# Patient Record
Sex: Male | Born: 1985 | Race: White | Hispanic: No | Marital: Married | Smoking: Former smoker
Health system: Southern US, Community
[De-identification: ages and names within clinical notes are randomized; demographics above are authoritative.]

---
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
## 2005-11-16 ENCOUNTER — Emergency Department (HOSPITAL_COMMUNITY): Admission: EM | Admit: 2005-11-16 | Discharge: 2005-11-16 | Payer: Self-pay | Admitting: Emergency Medicine

## 2008-11-06 ENCOUNTER — Emergency Department (HOSPITAL_COMMUNITY): Admission: EM | Admit: 2008-11-06 | Discharge: 2008-11-06 | Payer: Self-pay | Admitting: Emergency Medicine

## 2009-01-30 ENCOUNTER — Emergency Department (HOSPITAL_COMMUNITY): Admission: EM | Admit: 2009-01-30 | Discharge: 2009-01-30 | Payer: Self-pay | Admitting: Emergency Medicine

## 2010-08-21 ENCOUNTER — Emergency Department (HOSPITAL_COMMUNITY)
Admission: EM | Admit: 2010-08-21 | Discharge: 2010-08-21 | Disposition: A | Discharge status: Home / Self Care | Payer: Self-pay | Attending: Emergency Medicine | Admitting: Emergency Medicine

## 2010-08-21 ENCOUNTER — Emergency Department (HOSPITAL_COMMUNITY): Payer: Self-pay

## 2010-08-21 DIAGNOSIS — X500XXA Overexertion from strenuous movement or load, initial encounter: Secondary | ICD-10-CM | POA: Insufficient documentation

## 2010-08-21 DIAGNOSIS — S93409A Sprain of unspecified ligament of unspecified ankle, initial encounter: Secondary | ICD-10-CM | POA: Insufficient documentation

## 2010-08-21 DIAGNOSIS — F17200 Nicotine dependence, unspecified, uncomplicated: Secondary | ICD-10-CM | POA: Insufficient documentation

## 2011-03-28 ENCOUNTER — Other Ambulatory Visit: Payer: Self-pay | Admitting: Physician Assistant

## 2011-04-17 ENCOUNTER — Emergency Department (HOSPITAL_COMMUNITY)
Admission: EM | Admit: 2011-04-17 | Discharge: 2011-04-17 | Disposition: A | Discharge status: Home / Self Care | Payer: Self-pay | Attending: Emergency Medicine | Admitting: Emergency Medicine

## 2011-04-17 ENCOUNTER — Encounter (HOSPITAL_COMMUNITY): Payer: Self-pay | Admitting: *Deleted

## 2011-04-17 DIAGNOSIS — J029 Acute pharyngitis, unspecified: Secondary | ICD-10-CM | POA: Insufficient documentation

## 2011-04-17 MED ORDER — PENICILLIN G BENZATHINE 1200000 UNIT/2ML IM SUSP
1.2000 10*6.[IU] | Freq: Once | INTRAMUSCULAR | Status: AC
  Administered 2011-04-17: 1.2 10*6.[IU] via INTRAMUSCULAR
  Filled 2011-04-17: qty 2

## 2011-04-17 NOTE — Discharge Instructions (Signed)
Sore Throat A sore throat is felt inside the throat and at the back of the mouth. It hurts to swallow or the throat may feel dry and scratchy. It can be caused by germs, smoking, pollution, or allergies.  HOME CARE   Only take medicine as told by your doctor.   Drink enough fluids to keep your pee (urine) clear or pale yellow.   Eat soft foods.   Do not smoke.   Rinse the mouth (gargle) with warm water or salt water ( teaspoon salt in 8 ounces of water).   Try throat sprays, lozenges, or suck on hard candy.  GET HELP RIGHT AWAY IF:   You have trouble breathing.   Your sore throat lasts longer than 1 week.   There is more puffiness (swelling) in the throat.   The pain is so bad that you are unable to swallow.   You have a very bad headache or a red rash.   You start to throw up (vomit).   You or your child has a temperature by mouth above 102 F (38.9 C), not controlled by medicine.   Your baby is older than 3 months with a rectal temperature of 102 F (38.9 C) or higher.   Your baby is 85 months old or younger with a rectal temperature of 100.4 F (38 C) or higher.  MAKE SURE YOU:   Understand these instructions.   Will watch your condition.   Will get help right away if you are not doing well or get worse.  Document Released: 11/21/2007 Document Revised: 10/24/2010 Document Reviewed: 11/21/2007 [facility] Patient Information 2012 [place], [place].   You have been treated empirically for strep throat.  Take tylenol or ibuprofen  Regularly for fever or pain.  Gargle frequently with salt water.  Chloraseptic will also help.

## 2011-04-17 NOTE — ED Notes (Signed)
Pt presents with sore throat and mild headache.  Throat red and sl swollen.

## 2011-04-17 NOTE — ED Notes (Signed)
Sore throat 5 days

## 2011-04-17 NOTE — ED Provider Notes (Signed)
Medical screening examination/treatment/procedure(s) were performed by non-physician practitioner and as supervising physician I was immediately available for consultation/collaboration.   Benny Lennert, MD 04/17/11 2239

## 2011-04-17 NOTE — ED Provider Notes (Signed)
History     CSN: [id number]  Arrival date & time 04/17/11  1818   First MD Initiated Contact with Patient 04/17/11 1840      Chief Complaint  Patient presents with  . Sore Throat    (Consider location/radiation/quality/duration/timing/severity/associated sxs/prior treatment) Patient is a 26 y.o. male presenting with pharyngitis. The history is provided by the patient. No language interpreter was used.  Sore Throat This is a new problem. The current episode started in the past 7 days. The problem occurs constantly. The problem has been gradually worsening. Associated symptoms include chills, a fever, headaches, myalgias, a sore throat and swollen glands. The symptoms are aggravated by swallowing. He has tried nothing for the symptoms.    History reviewed. No pertinent past medical history.  History reviewed. No pertinent past surgical history.  No family history on file.  History  Substance Use Topics  . Smoking status: Former Games developer  . Smokeless tobacco: Not on file  . Alcohol Use: No      Review of Systems  Constitutional: Positive for fever and chills.  HENT: Positive for ear pain and sore throat.   Musculoskeletal: Positive for myalgias.  Neurological: Positive for headaches.  All other systems reviewed and are negative.    Allergies  Codeine  Home Medications   Current Outpatient Rx  Name Route Sig Dispense Refill  . ACETAMINOPHEN 500 MG PO TABS Oral Take 1,500 mg by mouth as needed. For pain    . IBUPROFEN 200 MG PO TABS Oral Take 800-1,000 mg by mouth as needed. For pain    . LORATADINE 10 MG PO TABS Oral Take 10 mg by mouth once as needed. For allergy relief      BP 152/92  Pulse 83  Temp(Src) 98.1 F (36.7 C) (Oral)  Resp 16  SpO2 98%  Physical Exam  Nursing note and vitals reviewed. Constitutional: He is oriented to person, place, and time. He appears well-developed and well-nourished.  HENT:  Head: Normocephalic and atraumatic.    Mouth/Throat: Uvula is midline and mucous membranes are normal. No uvula swelling. Oropharyngeal exudate present. No posterior oropharyngeal edema, posterior oropharyngeal erythema or tonsillar abscesses.  Eyes: EOM are normal.  Neck: Normal range of motion.  Cardiovascular: Normal rate, regular rhythm, normal heart sounds and intact distal pulses.   Pulmonary/Chest: Effort normal and breath sounds normal. No respiratory distress.  Abdominal: Soft. He exhibits no distension. There is no tenderness.  Musculoskeletal: Normal range of motion.  Lymphadenopathy:    He has cervical adenopathy.  Neurological: He is alert and oriented to person, place, and time.  Skin: Skin is warm and dry.  Psychiatric: He has a normal mood and affect. Judgment normal.    ED Course  Procedures (including critical care time)  Labs Reviewed - No data to display No results found.   No diagnosis found.    MDM          Worthy Rancher, PA 04/17/11 1904  Worthy Rancher, PA 04/17/11 1907

## 2018-12-22 ENCOUNTER — Other Ambulatory Visit: Payer: Self-pay

## 2018-12-22 DIAGNOSIS — Z20822 Contact with and (suspected) exposure to covid-19: Secondary | ICD-10-CM

## 2018-12-24 LAB — NOVEL CORONAVIRUS, NAA: SARS-CoV-2, NAA: NOT DETECTED

## 2019-05-05 ENCOUNTER — Other Ambulatory Visit: Payer: Self-pay

## 2019-05-05 ENCOUNTER — Ambulatory Visit
Admission: EM | Admit: 2019-05-05 | Discharge: 2019-05-05 | Disposition: A | Discharge status: Home / Self Care | Payer: Self-pay | Attending: Emergency Medicine | Admitting: Emergency Medicine

## 2019-05-05 DIAGNOSIS — H9202 Otalgia, left ear: Secondary | ICD-10-CM

## 2019-05-05 DIAGNOSIS — M79641 Pain in right hand: Secondary | ICD-10-CM

## 2019-05-05 DIAGNOSIS — R202 Paresthesia of skin: Secondary | ICD-10-CM

## 2019-05-05 DIAGNOSIS — H66002 Acute suppurative otitis media without spontaneous rupture of ear drum, left ear: Secondary | ICD-10-CM

## 2019-05-05 DIAGNOSIS — R229 Localized swelling, mass and lump, unspecified: Secondary | ICD-10-CM

## 2019-05-05 MED ORDER — AMOXICILLIN-POT CLAVULANATE 875-125 MG PO TABS: 1.0000 | ORAL_TABLET | Freq: Two times a day (BID) | ORAL | 0 refills | Status: AC

## 2019-05-05 NOTE — ED Provider Notes (Signed)
[facility]   [id number] 05/05/19 Arrival Time: 1331  CC: EAR PAIN, forearm pain, and bump on neck  SUBJECTIVE: History from: patient.  Joseph Aguirre is a 34 y.o. male who presents with of bilateral ear pain x 1 month, L > R.  Denies a precipitating event, such as swimming or wearing ear plugs.  Does admit to cleaning his ears with q-tips.  Patient states the pain is intermittent and sharp in character.  Patient has tried ear irrigation without relief.  Denies aggravating factors. Reports pulling white and black "stuff" out of LT ear.   Also mentions intermittent numbness/ tingling/ discomfort to RT hand that radiates to elbow x few months. Occurs daily. Denies precipitating event or injury.  Worse with eating motions about the wrist.  Family hx significant for charcot tooth marie syndrome.  Denies personal hx of this.    Also mentions bump to back of neck x few months.  Hair dresser has encouraged him to have it checked out.  States it sometimes gets bigger.  Denies pain.  Does not have a PCP.  Denies fever, chills, fatigue, sinus pain, rhinorrhea, sore throat, cough, SOB, wheezing, chest pain, nausea, changes in bowel or bladder habits, erythema, swelling, ecchymosis.    ROS: As per HPI.  All other pertinent ROS negative.     History reviewed. No pertinent past medical history. History reviewed. No pertinent surgical history. Allergies  Allergen Reactions  . Codeine Other (See Comments)    UNKNOWN REACTION   No current facility-administered medications on file prior to encounter.   Current Outpatient Medications on File Prior to Encounter  Medication Sig Dispense Refill  . acetaminophen (TYLENOL) 500 MG tablet Take 1,500 mg by mouth as needed. For pain    . ibuprofen (ADVIL,MOTRIN) 200 MG tablet Take 800-1,000 mg by mouth as needed. For pain    . [DISCONTINUED] loratadine (ALLERGY RELIEF) 10 MG tablet Take 10 mg by mouth once as needed. For allergy relief     Social  History   Socioeconomic History  . Marital status: Single    Spouse name: Not on file  . Number of children: Not on file  . Years of education: Not on file  . Highest education level: Not on file  Occupational History  . Not on file  Tobacco Use  . Smoking status: Former Games developer  . Smokeless tobacco: Never Used  Substance and Sexual Activity  . Alcohol use: No  . Drug use: No  . Sexual activity: Not on file  Other Topics Concern  . Not on file  Social History Narrative  . Not on file   Social Determinants of Health   Financial Resource Strain:   . Difficulty of Paying Living Expenses: Not on file  Food Insecurity:   . Worried About Programme researcher, broadcasting/film/video in the Last Year: Not on file  . Ran Out of Food in the Last Year: Not on file  Transportation Needs:   . Lack of Transportation (Medical): Not on file  . Lack of Transportation (Non-Medical): Not on file  Physical Activity:   . Days of Exercise per Week: Not on file  . Minutes of Exercise per Session: Not on file  Stress:   . Feeling of Stress : Not on file  Social Connections:   . Frequency of Communication with Friends and Family: Not on file  . Frequency of Social Gatherings with Friends and Family: Not on file  . Attends Religious Services: Not on file  .  Active Member of Clubs or Organizations: Not on file  . Attends Archivist Meetings: Not on file  . Marital Status: Not on file  Intimate Partner Violence:   . Fear of Current or Ex-Partner: Not on file  . Emotionally Abused: Not on file  . Physically Abused: Not on file  . Sexually Abused: Not on file   Family History  Problem Relation Age of Onset  . Healthy Mother   . Healthy Father     OBJECTIVE:  Vitals:   05/05/19 1333  BP: (!) 151/91  Pulse: 88  Resp: 18  Temp: 98 F (36.7 C)  TempSrc: Oral  SpO2: 96%     General appearance: alert; well-appearing, nontoxic HEENT: Ears: RT EAC with erythema, TM appears injected, LT EAC with white  exudate in 9-10 o'clock position, purulent green discharge behind the TM, erythema present; Eyes: PERRL, EOMI grossly; Nose: patent without rhinorrhea; Throat: oropharynx clear, tonsils not enlarged or erythematous without white tonsillar exudates, uvula midline Neck: supple without LAD Lungs: unlabored respirations, symmetrical air entry; cough: absent; no respiratory distress Heart: regular rate and rhythm.  Radial pulses 2+ symmetrical bilaterally RT upper Extremity: skin intact without erythema, swelling or ecchymosis; NTTP; FROM; strength and sensation intact about the upper extremity; negative phalen's and tinels Skin: warm and dry; 1 cm freely moveable lump to base of skull, cystic in nature, NTTP, no obvious erythema, discharge or bleeding Psychological: alert and cooperative; normal mood and affect  PROCEDURE:  Consent granted.  LT ear lavage performed by RN.  TM visualized.  PT tolerated procedure well.    ASSESSMENT & PLAN:  1. Left ear pain   2. Non-recurrent acute suppurative otitis media of left ear without spontaneous rupture of tympanic membrane   3. Right hand paresthesia   4. Right hand pain   5. Lump of skin     Meds ordered this encounter  Medications  . amoxicillin-clavulanate (AUGMENTIN) 875-125 MG tablet    Sig: Take 1 tablet by mouth every 12 (twelve) hours for 10 days.    Dispense:  20 tablet    Refill:  0    Order Specific Question:   Supervising Provider    Answer:   Raylene Everts [[id number]]   LT ear pain:  Ear lavage performed Rest and drink plenty of fluids Prescribed augmentin take as directed and to completion Continue to use OTC ibuprofen and/ or tylenol as needed for pain control Follow up with PCP if symptoms persists Return here or go to the ER if you have any new or worsening symptoms fever, chills, nausea, vomiting, ear discharge, worsening ear pain despite medication, etc...  RT arm pain: Rest, ice, elevate Use ibuprofen and/ or  tylenol as needed Buy OTC brace for support Follow up with orthopedist and/or PCP for further evaluation and management  Lump on neck: Appears cystic in nature If it is infected it should respond to the antibiotics you were given today Otherwise follow up with PCP or dermatology for further evaluation   Reviewed expectations re: course of current medical issues. Questions answered. Outlined signs and symptoms indicating need for more acute intervention. Patient verbalized understanding. After Visit Summary given.         Lestine Box, PA-C 05/05/19 1425

## 2019-05-05 NOTE — ED Triage Notes (Signed)
Pt states he has had left ear pain and occlusion x1 week. Pt has used ear drops and peroxide which helps some.   Pt also c/o sharp intermittent pain that goes from right wrist to right elbow. Pt states it happens randomly, with or without movement. Also occurs in left arm but mostly in right. Pt is right handed.

## 2019-05-05 NOTE — Discharge Instructions (Signed)
LT ear pain:  Ear lavage performed Rest and drink plenty of fluids Prescribed augmentin take as directed and to completion Continue to use OTC ibuprofen and/ or tylenol as needed for pain control Follow up with PCP if symptoms persists Return here or go to the ER if you have any new or worsening symptoms fever, chills, nausea, vomiting, ear discharge, worsening ear pain despite medication, etc...  RT arm pain: Rest, ice, elevate Use ibuprofen and/ or tylenol as needed Buy OTC brace for support Follow up with orthopedist and/or PCP for further evaluation and management  Lump on neck: Appears cystic in nature If it is infected it should respond to the antibiotics you were given today Otherwise follow up with PCP or dermatology for further evaluation

## 2020-01-28 ENCOUNTER — Ambulatory Visit
Admission: EM | Admit: 2020-01-28 | Discharge: 2020-01-28 | Disposition: A | Discharge status: Home / Self Care | Payer: Self-pay | Attending: Emergency Medicine | Admitting: Emergency Medicine

## 2020-01-28 ENCOUNTER — Other Ambulatory Visit: Payer: Self-pay

## 2020-01-28 ENCOUNTER — Ambulatory Visit (INDEPENDENT_AMBULATORY_CARE_PROVIDER_SITE_OTHER): Payer: Self-pay

## 2020-01-28 ENCOUNTER — Ambulatory Visit: Payer: Self-pay

## 2020-01-28 DIAGNOSIS — W19XXXA Unspecified fall, initial encounter: Secondary | ICD-10-CM

## 2020-01-28 DIAGNOSIS — M79671 Pain in right foot: Secondary | ICD-10-CM

## 2020-01-28 DIAGNOSIS — M25571 Pain in right ankle and joints of right foot: Secondary | ICD-10-CM

## 2020-01-28 DIAGNOSIS — M25532 Pain in left wrist: Secondary | ICD-10-CM

## 2020-01-28 MED ORDER — IBUPROFEN 800 MG PO TABS: 800.0000 mg | ORAL_TABLET | Freq: Three times a day (TID) | ORAL | 0 refills | Status: AC

## 2020-01-28 NOTE — ED Provider Notes (Signed)
[facility]   [id number] 01/28/20 Arrival Time: 1728   Chief Complaint  Patient presents with  . Hand Injury     SUBJECTIVE: History from: patient and family.  Joseph Aguirre is a 34 y.o. male who presented to the urgent care with a complaint of left wrist and the right foot/ankle pain that occurred today.  Developed the symptom after falling and tripping over a cord.  He localizes the pain to the left wrist and right foot/ankle.  He describes the pain as constant and achy.  He has tried OTC medications without relief.  His symptoms are made worse with ROM.  He denies similar symptoms in the past.  Denies chills, fever, nausea, vomiting, diarrhea   ROS: As per HPI.  All other pertinent ROS negative.     History reviewed. No pertinent past medical history. History reviewed. No pertinent surgical history. Allergies  Allergen Reactions  . Codeine Other (See Comments)    UNKNOWN REACTION   No current facility-administered medications on file prior to encounter.   Current Outpatient Medications on File Prior to Encounter  Medication Sig Dispense Refill  . acetaminophen (TYLENOL) 500 MG tablet Take 1,500 mg by mouth as needed. For pain    . [DISCONTINUED] loratadine (ALLERGY RELIEF) 10 MG tablet Take 10 mg by mouth once as needed. For allergy relief     Social History   Socioeconomic History  . Marital status: Single    Spouse name: Not on file  . Number of children: Not on file  . Years of education: Not on file  . Highest education level: Not on file  Occupational History  . Not on file  Tobacco Use  . Smoking status: Former Games developer  . Smokeless tobacco: Never Used  Substance and Sexual Activity  . Alcohol use: No  . Drug use: No  . Sexual activity: Not on file  Other Topics Concern  . Not on file  Social History Narrative  . Not on file   Social Determinants of Health   Financial Resource Strain:   . Difficulty of Paying Living Expenses: Not on file    Food Insecurity:   . Worried About Programme researcher, broadcasting/film/video in the Last Year: Not on file  . Ran Out of Food in the Last Year: Not on file  Transportation Needs:   . Lack of Transportation (Medical): Not on file  . Lack of Transportation (Non-Medical): Not on file  Physical Activity:   . Days of Exercise per Week: Not on file  . Minutes of Exercise per Session: Not on file  Stress:   . Feeling of Stress : Not on file  Social Connections:   . Frequency of Communication with Friends and Family: Not on file  . Frequency of Social Gatherings with Friends and Family: Not on file  . Attends Religious Services: Not on file  . Active Member of Clubs or Organizations: Not on file  . Attends Banker Meetings: Not on file  . Marital Status: Not on file  Intimate Partner Violence:   . Fear of Current or Ex-Partner: Not on file  . Emotionally Abused: Not on file  . Physically Abused: Not on file  . Sexually Abused: Not on file   Family History  Problem Relation Age of Onset  . Healthy Mother   . Healthy Father     OBJECTIVE:  Vitals:   01/28/20 1739  BP: (!) 157/97  Pulse: 84  Resp: 19  Temp: 98.3  F (36.8 C)  SpO2: 96%     Physical Exam Vitals and nursing note reviewed.  Constitutional:      General: He is not in acute distress.    Appearance: Normal appearance. He is normal weight. He is not ill-appearing, toxic-appearing or diaphoretic.  Cardiovascular:     Rate and Rhythm: Normal rate and regular rhythm.     Pulses: Normal pulses.     Heart sounds: Normal heart sounds. No murmur heard.  No friction rub. No gallop.   Pulmonary:     Effort: Pulmonary effort is normal. No respiratory distress.     Breath sounds: Normal breath sounds. No stridor. No wheezing, rhonchi or rales.  Chest:     Chest wall: No tenderness.  Musculoskeletal:        General: Tenderness present.     Right wrist: Normal.     Left wrist: Tenderness present.     Right ankle: Swelling  present. Tenderness present.     Left ankle: Normal.     Right foot: Swelling and tenderness present.     Left foot: Normal.     Comments: The left wrist is with obvious deformity when compared to the right wrist.  Swelling present.  There is no ecchymosis, open wound, lesion, warmth present.  Limited range of motion due to pain.  Neurovascular status intact.   The right foot/ankle is with obvious deformity when compared to the left foot/ankle.  Swelling present.  There is no ecchymosis, open wound, lesion present.  Limited range of motion.  Partial weightbearing. Neurovascular status intact.  Neurological:     Mental Status: He is alert and oriented to person, place, and time.     LABS:  No results found for this or any previous visit (from the past 24 hour(s)).   RADIOLOGY:  DG Wrist Complete Left  Result Date: 01/28/2020 CLINICAL DATA:  34 year old male with trauma to the left wrist. EXAM: LEFT WRIST - COMPLETE 3+ VIEW COMPARISON:  None. FINDINGS: There is no acute fracture or dislocation. No significant arthritic changes. The soft tissues are grossly unremarkable. IMPRESSION: Negative. Electronically Signed   By: Elgie Collard M.D.   On: 01/28/2020 18:39     Left wrist and right ankle/foot x-ray are negative for bony abnormality including fracture or dislocation.  I have reviewed the x-ray myself and the radiologist interpretation.  I am in agreement with the radiologist interpretation.   ASSESSMENT & PLAN:  1. Left wrist pain   2. Acute right ankle pain   3. Right foot pain     Meds ordered this encounter  Medications  . ibuprofen (ADVIL) 800 MG tablet    Sig: Take 1 tablet (800 mg total) by mouth 3 (three) times daily.    Dispense:  30 tablet    Refill:  0    Discharge instructions  Ibuprofen 800 mg was prescribed for pain/take as directed with food Follow RICE instruction that is attached  Follow-up with PCP Return or go to ED if you develop any new or  worsening of symptoms   Reviewed expectations re: course of current medical issues. Questions answered. Outlined signs and symptoms indicating need for more acute intervention. Patient verbalized understanding. After Visit Summary given.         Durward Parcel, FNP 01/28/20 1915

## 2020-01-28 NOTE — ED Triage Notes (Addendum)
Pt presents with injury to left hand. Patient was working on a car and tripped over a cord. When he fell he landed on his left hand. Limited range of motion in left wrist.  Pt is also having pain in his left ankle, states he thinks it is where he tripped on the cord. Foot is swollen and pain is worse with movement.

## 2020-01-28 NOTE — Discharge Instructions (Signed)
Ibuprofen 800 mg was prescribed for pain/take as directed with food Follow RICE instruction that is attached  Follow-up with PCP Return or go to ED if you develop any new or worsening of symptom

## 2021-01-22 ENCOUNTER — Ambulatory Visit
Admission: EM | Admit: 2021-01-22 | Discharge: 2021-01-22 | Disposition: A | Discharge status: Home / Self Care | Payer: Self-pay | Attending: Family Medicine | Admitting: Family Medicine

## 2021-01-22 ENCOUNTER — Other Ambulatory Visit: Payer: Self-pay

## 2021-01-22 DIAGNOSIS — S61313A Laceration without foreign body of left middle finger with damage to nail, initial encounter: Secondary | ICD-10-CM

## 2021-01-22 MED ORDER — HIBICLENS 4 % EX LIQD: Freq: Every day | CUTANEOUS | 0 refills | Status: DC | PRN

## 2021-01-22 MED ORDER — MUPIROCIN 2 % EX OINT: 1.0000 "application " | TOPICAL_OINTMENT | Freq: Two times a day (BID) | CUTANEOUS | 0 refills | Status: DC

## 2021-01-22 MED ORDER — TRAMADOL HCL 50 MG PO TABS: 50.0000 mg | ORAL_TABLET | Freq: Two times a day (BID) | ORAL | 0 refills | Status: DC | PRN

## 2021-01-22 NOTE — ED Triage Notes (Signed)
Pt has laceration to tip of left 2nd finger  , heavy object fell on finger , bleeding controlled

## 2021-01-27 NOTE — ED Provider Notes (Signed)
[facility]    CSN: [id number] Arrival date & time: 01/22/21  1759      History   Chief Complaint Chief Complaint  Patient presents with   Laceration    HPI OSTIN MATHEY is a 35 y.o. male.   Presenting today with crush injury to left middle finger. 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 He states his ROM is intact, no numbness or tingling and bleeding fairly controlled from laceration sites with direct pressure. Rinsed with soap and water so far. Unclear on last tdap.    History reviewed. No pertinent past medical history.  There are no problems to display for this patient.   History reviewed. No pertinent surgical history.     Home Medications    Prior to Admission medications   Medication Sig Start Date End Date Taking? Authorizing Provider  chlorhexidine (HIBICLENS) 4 % external liquid Apply topically daily as needed. 01/22/21  Yes Particia Nearing, PA-C  mupirocin ointment (BACTROBAN) 2 % Apply 1 application topically 2 (two) times daily. 01/22/21  Yes Particia Nearing, PA-C  traMADol (ULTRAM) 50 MG tablet Take 1 tablet (50 mg total) by mouth every 12 (twelve) hours as needed. 01/22/21  Yes Particia Nearing, PA-C  acetaminophen (TYLENOL) 500 MG tablet Take 1,500 mg by mouth as needed. For pain    [provider]  ibuprofen (ADVIL) 800 MG tablet Take 1 tablet (800 mg total) by mouth 3 (three) times daily. 01/28/20   Avegno, Zachery Dakins, FNP  loratadine (ALLERGY RELIEF) 10 MG tablet Take 10 mg by mouth once as needed. For allergy relief  05/05/19  [provider]    Family History Family History  Problem Relation Age of Onset   Healthy Mother    Healthy Father     Social History Social History   Tobacco Use   Smoking status: Former   Smokeless tobacco: Never  Substance Use Topics   Alcohol use: No   Drug use: No     Allergies   Codeine   Review of Systems Review of  Systems PER HPI   Physical Exam Triage Vital Signs ED Triage Vitals  Enc Vitals Group     BP 01/22/21 1948 (!) 168/100     Pulse Rate 01/22/21 1948 75     Resp 01/22/21 1948 20     Temp 01/22/21 1948 98.5 F (36.9 C)     Temp src --      SpO2 01/22/21 1948 98 %     Weight --      Height --      Head Circumference --      Peak Flow --      Pain Score 01/22/21 1945 4     Pain Loc --      Pain Edu? --      Excl. in GC? --    No data found.  Updated Vital Signs BP (!) 168/100   Pulse 75   Temp 98.5 F (36.9 C)   Resp 20   SpO2 98%   Visual Acuity Right Eye Distance:   Left Eye Distance:   Bilateral Distance:    Right Eye Near:   Left Eye Near:    Bilateral Near:     Physical Exam Vitals and nursing note reviewed.  Constitutional:      Appearance: Normal appearance.  HENT:     Head: Atraumatic.  Eyes:     Extraocular Movements: Extraocular  movements intact.     Conjunctiva/sclera: Conjunctivae normal.  Cardiovascular:     Rate and Rhythm: Normal rate and regular rhythm.  Pulmonary:     Effort: Pulmonary effort is normal.     Breath sounds: Normal breath sounds.  Musculoskeletal:        General: Tenderness and signs of injury present. Normal range of motion.     Cervical back: Normal range of motion and neck supple.  Skin:    General: Skin is warm.     Comments: Slightly gaping laceration to b/l edges of distal left middle finger. Small amount of distal nail avulsed. Bleeding well controlled. Edges of lacerations not able to approximate.   Neurological:     General: No focal deficit present.     Mental Status: He is oriented to person, place, and time.     Comments: Left hand neurovascularly intact  Psychiatric:        Mood and Affect: Mood normal.        Thought Content: Thought content normal.        Judgment: Judgment normal.     UC Treatments / Results  Labs (all labs ordered are listed, but only abnormal results are displayed) Labs Reviewed  - No data to display  EKG   Radiology No results found.  Procedures Laceration Repair  Date/Time: 01/27/2021 7:02 AM Performed by: Particia Nearing, PA-C Authorized by: Particia Nearing, PA-C   Consent:    Consent obtained:  Verbal   Consent given by:  Patient   Risks, benefits, and alternatives were discussed: yes     Risks discussed:  Infection and pain Universal protocol:    Procedure explained and questions answered to patient or proxy's satisfaction: yes     Patient identity confirmed:  Verbally with patient Anesthesia:    Anesthesia method:  None Laceration details:    Location:  Finger   Finger location:  L long finger   Length (cm):  2   Depth (mm):  2 Pre-procedure details:    Preparation:  Patient was prepped and draped in usual sterile fashion Exploration:    Limited defect created (wound extended): no     Hemostasis achieved with:  Direct pressure   Wound exploration: wound explored through full range of motion     Contaminated: no   Treatment:    Area cleansed with:  Chlorhexidine   Amount of cleaning:  Standard   Irrigation solution:  Sterile saline   Irrigation method:  Pressure wash   Visualized foreign bodies/material removed: no     Debridement:  None Skin repair:    Repair method:  Tissue adhesive Approximation:    Approximation:  Loose Repair type:    Repair type:  Simple Post-procedure details:    Dressing:  Splint for protection and non-adherent dressing   Procedure completion:  Tolerated (including critical care time)  Medications Ordered in UC Medications - No data to display  Initial Impression / Assessment and Plan / UC Course  I have reviewed the triage vital signs and the nursing notes.  Pertinent labs & imaging results that were available during my care of the patient were reviewed by me and considered in my medical decision making (see chart for details).     Lacerations stable without suture and not able to be  well approximated, but offered sutures to tack edges down. He opts for dermabond closure after thorough cleaning with hibiclens. He declines x-ray or tdap today though recommended both. Given extent  of laceration will start keflex, hibiclens and small amount of tramadol for pain control in addition to OTC pain relievers. Follow up for worsening sxs.   Final Clinical Impressions(s) / UC Diagnoses   Final diagnoses:  Laceration of left middle finger without foreign body with damage to nail, initial encounter   Discharge Instructions   None    ED Prescriptions     Medication Sig Dispense Auth. Provider   chlorhexidine (HIBICLENS) 4 % external liquid Apply topically daily as needed. 120 mL Particia Nearing, PA-C   mupirocin ointment (BACTROBAN) 2 % Apply 1 application topically 2 (two) times daily. 22 g Particia Nearing, [place]   traMADol (ULTRAM) 50 MG tablet Take 1 tablet (50 mg total) by mouth every 12 (twelve) hours as needed. 10 tablet Particia Nearing, [place]      I have reviewed the PDMP during this encounter.   Roosvelt Maser [place], [place] 01/27/21 [phone]

## 2022-02-01 IMAGING — DX DG FOOT COMPLETE 3+V*R*
3 series · 3 of 3 positions shown · non-contrast
Comparison: None.

CLINICAL DATA: 34-year-old male with trauma to the right ankle.

EXAM:
RIGHT FOOT COMPLETE - 3+ VIEW; RIGHT ANKLE - COMPLETE 3+ VIEW

[foot ap]
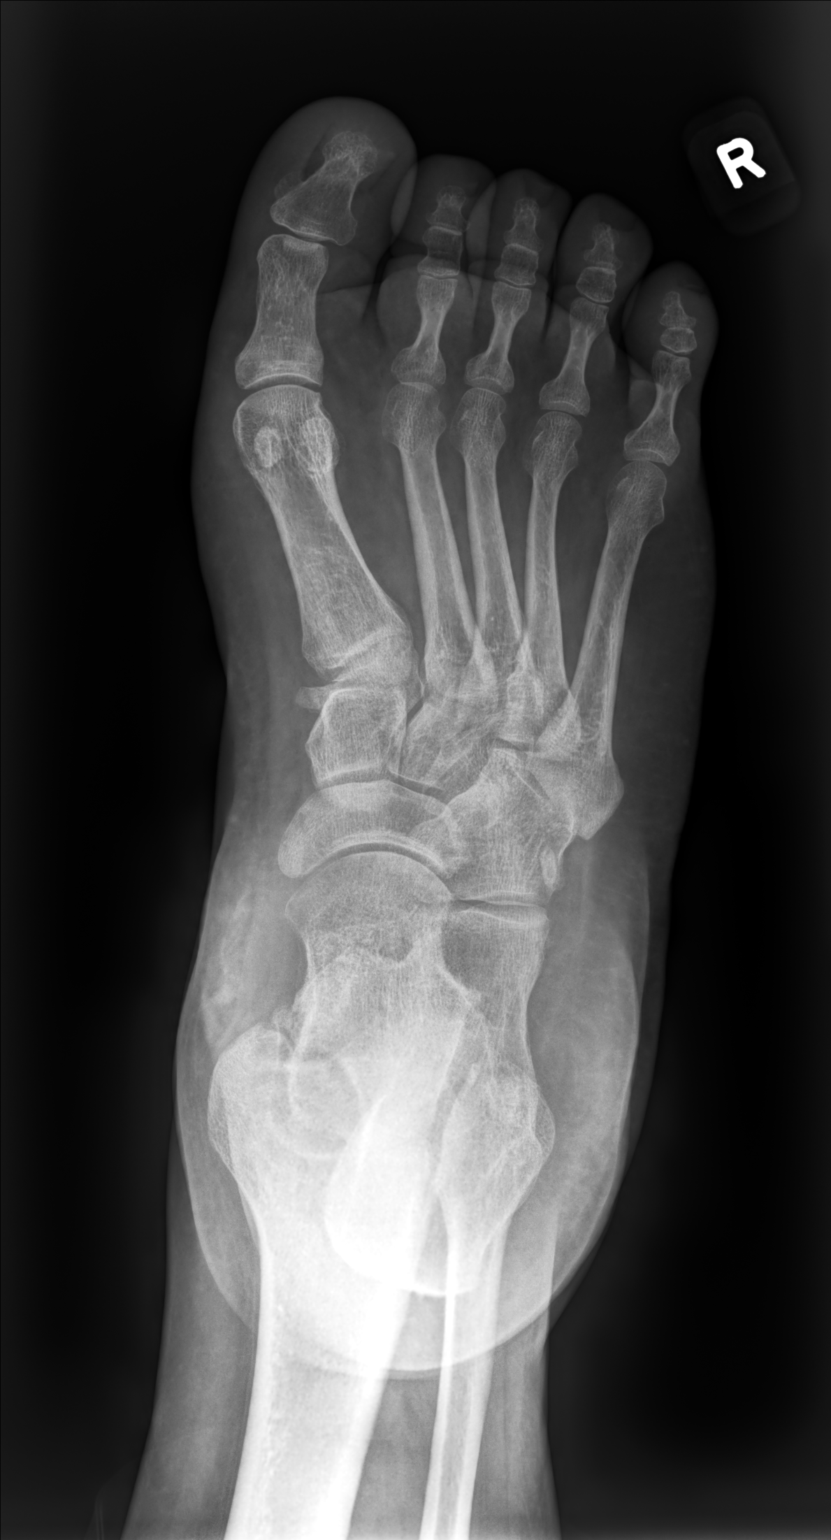

[foot mlo]
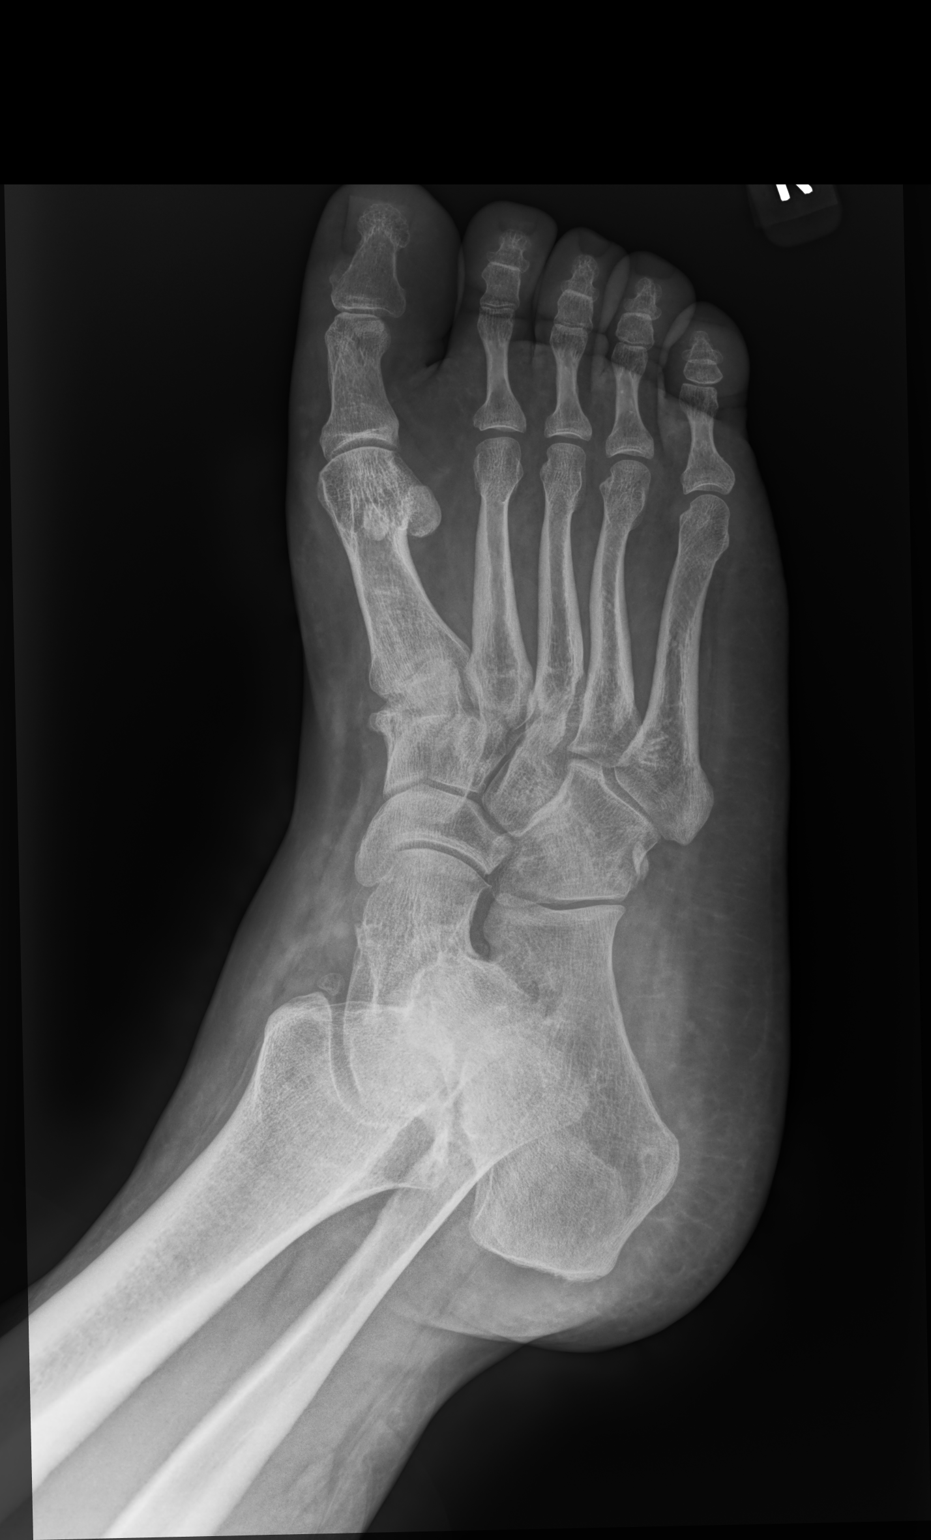

[foot lat]
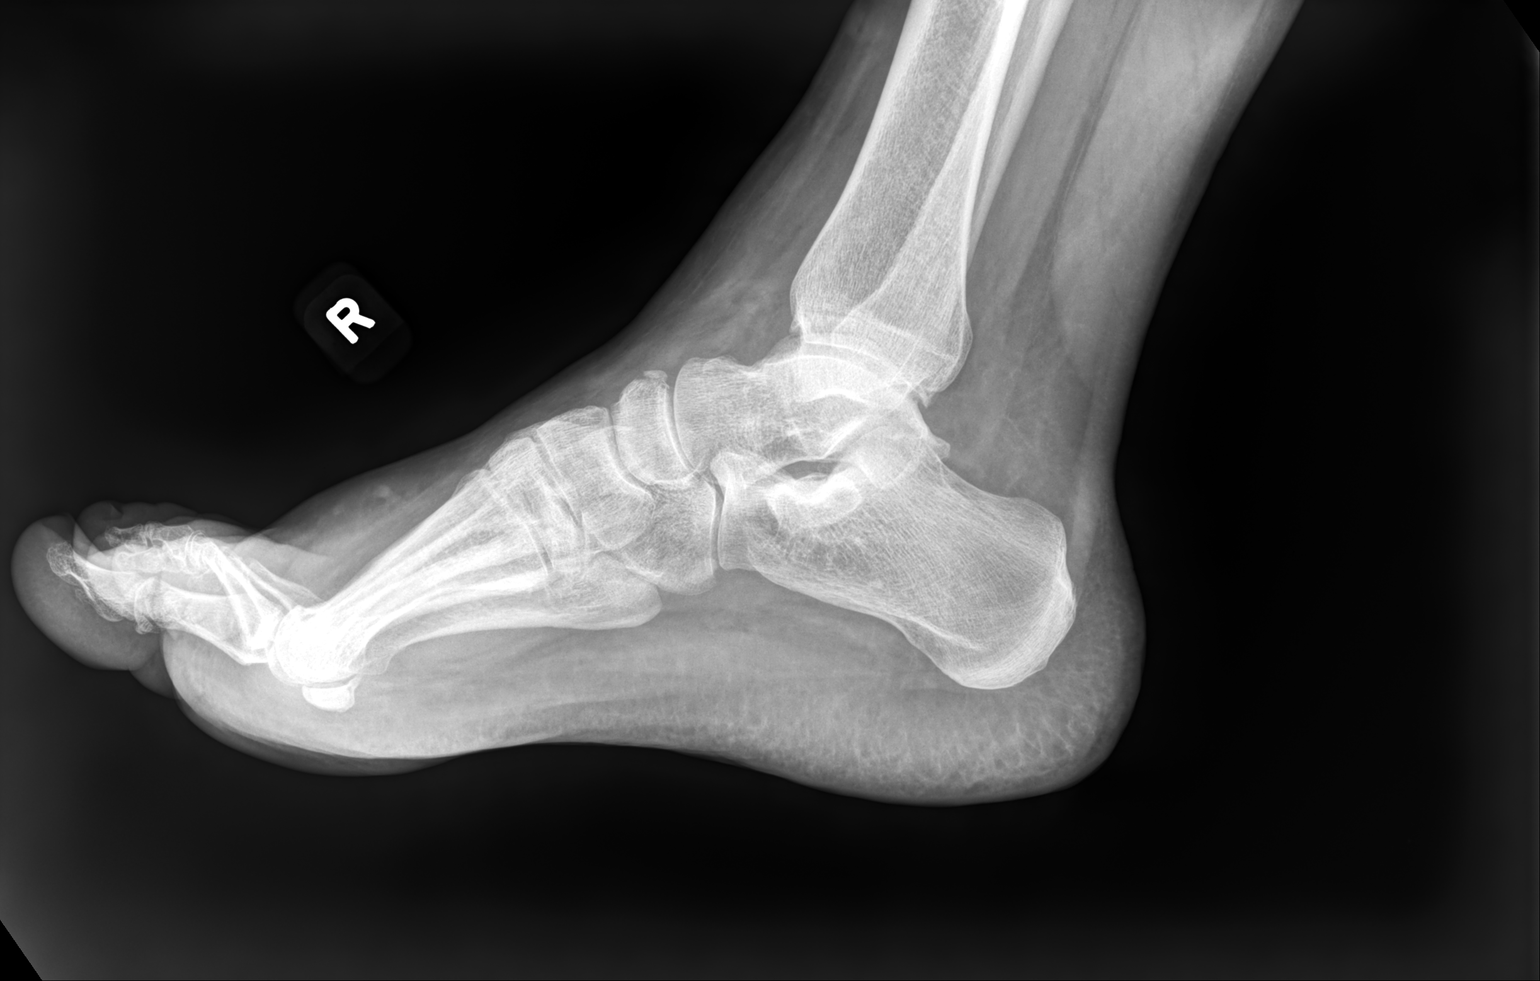

[3 of 3 positions shown; findings below may reference images not displayed]

FINDINGS: There is no acute fracture or dislocation. No significant arthritic
changes. The soft tissues are grossly unremarkable.
IMPRESSION: No acute fracture or dislocation.

## 2022-02-01 IMAGING — DX DG ANKLE COMPLETE 3+V*R*
3 series · 3 of 3 positions shown · non-contrast
Comparison: None.

CLINICAL DATA: 34-year-old male with trauma to the right ankle.

EXAM:
RIGHT FOOT COMPLETE - 3+ VIEW; RIGHT ANKLE - COMPLETE 3+ VIEW

[ankle ap]
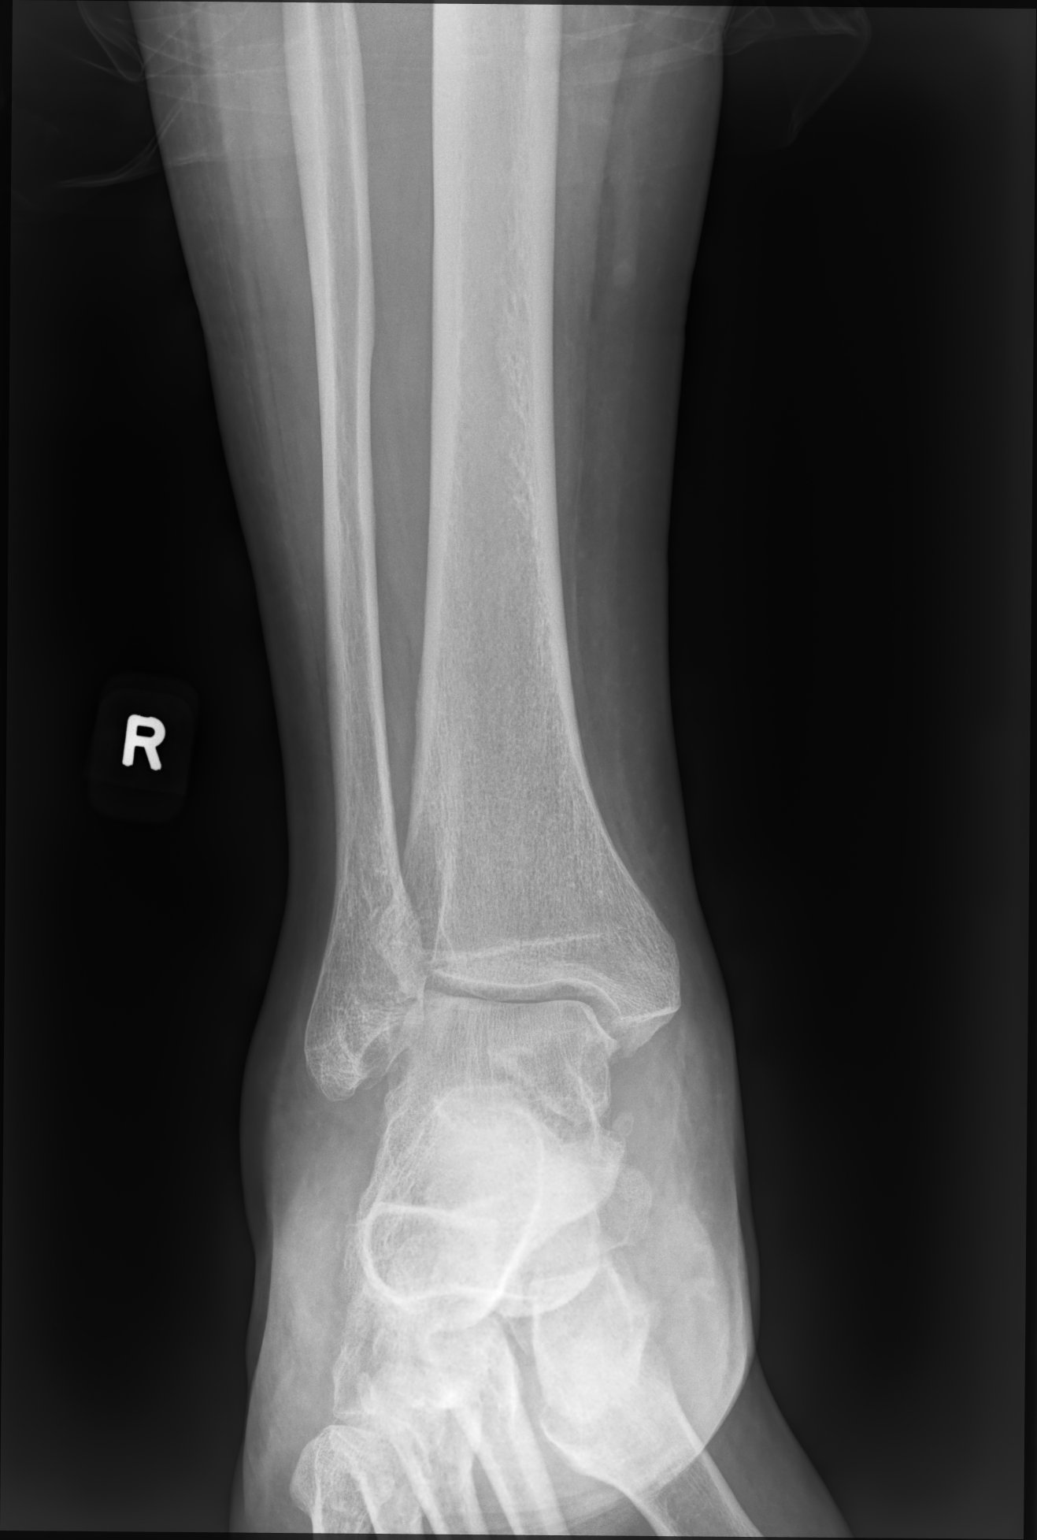

[ankle mlo]
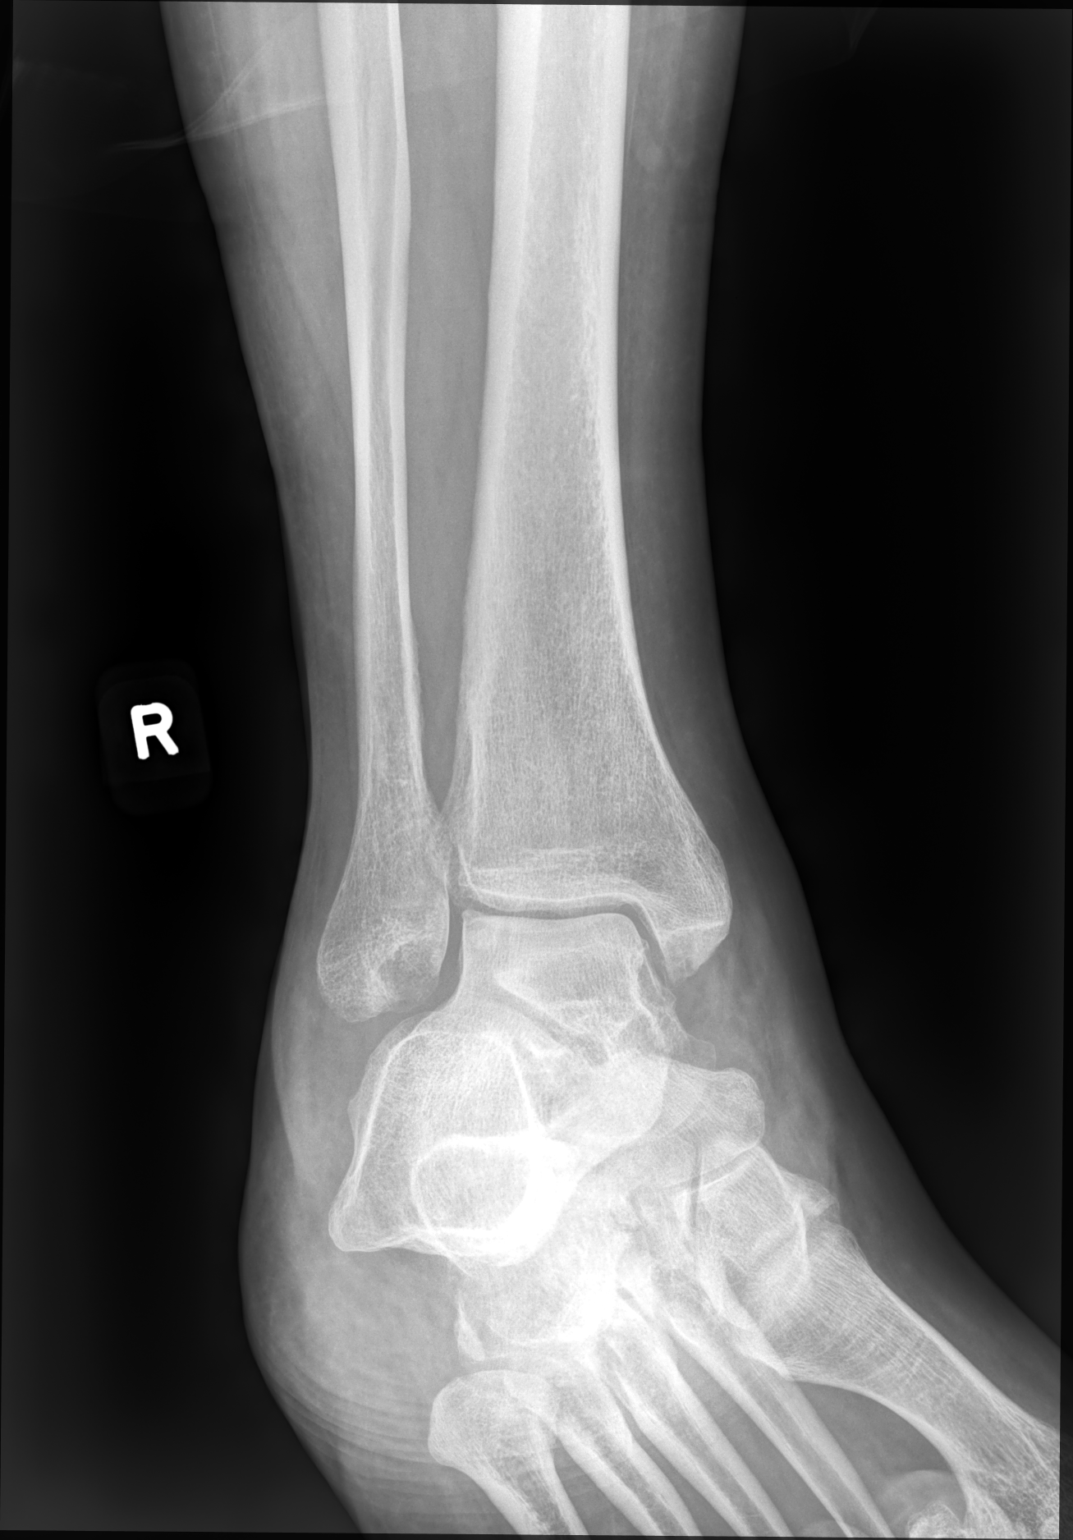

[ankle lat]
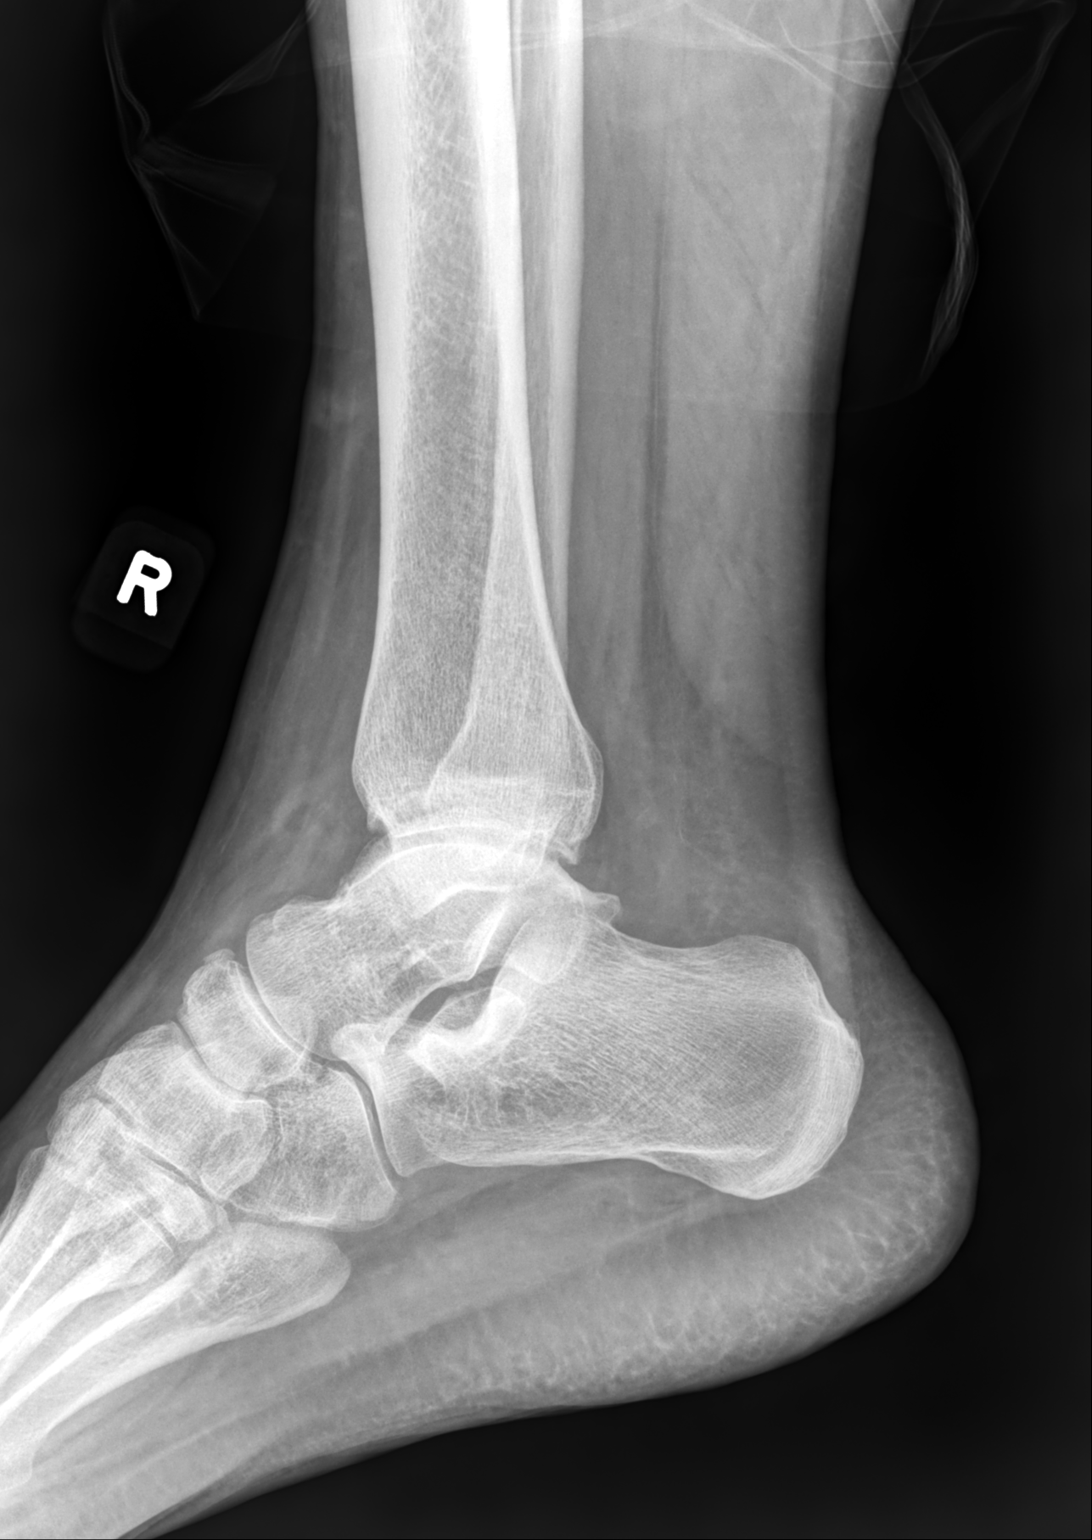

[3 of 3 positions shown; findings below may reference images not displayed]

FINDINGS: There is no acute fracture or dislocation. No significant arthritic
changes. The soft tissues are grossly unremarkable.
IMPRESSION: No acute fracture or dislocation.

## 2022-04-24 DIAGNOSIS — Z6841 Body Mass Index (BMI) 40.0 and over, adult: Secondary | ICD-10-CM | POA: Diagnosis not present

## 2022-04-24 DIAGNOSIS — E790 Hyperuricemia without signs of inflammatory arthritis and tophaceous disease: Secondary | ICD-10-CM | POA: Diagnosis not present

## 2022-06-21 DIAGNOSIS — L723 Sebaceous cyst: Secondary | ICD-10-CM | POA: Diagnosis not present

## 2022-06-21 DIAGNOSIS — F419 Anxiety disorder, unspecified: Secondary | ICD-10-CM | POA: Diagnosis not present

## 2022-06-21 DIAGNOSIS — R202 Paresthesia of skin: Secondary | ICD-10-CM | POA: Diagnosis not present

## 2022-06-21 DIAGNOSIS — K219 Gastro-esophageal reflux disease without esophagitis: Secondary | ICD-10-CM | POA: Diagnosis not present

## 2022-06-21 DIAGNOSIS — Z6841 Body Mass Index (BMI) 40.0 and over, adult: Secondary | ICD-10-CM | POA: Diagnosis not present

## 2022-06-21 DIAGNOSIS — R110 Nausea: Secondary | ICD-10-CM | POA: Diagnosis not present

## 2022-06-21 DIAGNOSIS — J310 Chronic rhinitis: Secondary | ICD-10-CM | POA: Diagnosis not present

## 2022-06-21 DIAGNOSIS — R030 Elevated blood-pressure reading, without diagnosis of hypertension: Secondary | ICD-10-CM | POA: Diagnosis not present

## 2022-06-24 ENCOUNTER — Other Ambulatory Visit (HOSPITAL_COMMUNITY): Payer: Self-pay | Admitting: Physician Assistant

## 2022-06-24 DIAGNOSIS — R110 Nausea: Secondary | ICD-10-CM

## 2022-07-10 ENCOUNTER — Ambulatory Visit (HOSPITAL_COMMUNITY)
Admission: RE | Admit: 2022-07-10 | Discharge: 2022-07-10 | Disposition: A | Discharge status: Home / Self Care | Payer: 59 | Source: Ambulatory Visit | Attending: Physician Assistant | Admitting: Physician Assistant

## 2022-07-10 DIAGNOSIS — R110 Nausea: Secondary | ICD-10-CM | POA: Diagnosis not present

## 2022-07-10 DIAGNOSIS — K760 Fatty (change of) liver, not elsewhere classified: Secondary | ICD-10-CM | POA: Diagnosis not present

## 2022-07-10 DIAGNOSIS — K8020 Calculus of gallbladder without cholecystitis without obstruction: Secondary | ICD-10-CM | POA: Diagnosis not present

## 2022-07-15 ENCOUNTER — Encounter (HOSPITAL_COMMUNITY): Payer: Self-pay | Admitting: Physician Assistant

## 2022-07-15 ENCOUNTER — Other Ambulatory Visit (HOSPITAL_COMMUNITY): Payer: Self-pay | Admitting: Physician Assistant

## 2022-07-15 DIAGNOSIS — N2889 Other specified disorders of kidney and ureter: Secondary | ICD-10-CM

## 2022-07-18 DIAGNOSIS — J342 Deviated nasal septum: Secondary | ICD-10-CM | POA: Diagnosis not present

## 2022-07-18 DIAGNOSIS — J310 Chronic rhinitis: Secondary | ICD-10-CM | POA: Diagnosis not present

## 2022-07-18 DIAGNOSIS — K8010 Calculus of gallbladder with chronic cholecystitis without obstruction: Secondary | ICD-10-CM | POA: Diagnosis not present

## 2022-07-18 DIAGNOSIS — J343 Hypertrophy of nasal turbinates: Secondary | ICD-10-CM | POA: Diagnosis not present

## 2022-08-22 ENCOUNTER — Encounter (HOSPITAL_COMMUNITY): Payer: Self-pay

## 2022-08-22 ENCOUNTER — Ambulatory Visit (HOSPITAL_COMMUNITY): Payer: 59

## 2022-08-28 ENCOUNTER — Other Ambulatory Visit (HOSPITAL_COMMUNITY): Payer: Self-pay | Admitting: Physician Assistant

## 2022-08-28 DIAGNOSIS — N2889 Other specified disorders of kidney and ureter: Secondary | ICD-10-CM

## 2022-09-11 ENCOUNTER — Encounter: Payer: Self-pay | Admitting: Diagnostic Neuroimaging

## 2022-09-11 ENCOUNTER — Ambulatory Visit: Payer: 59 | Admitting: Diagnostic Neuroimaging

## 2022-09-11 VITALS — BP 153/98 | HR 70 | Ht 70.0 in | Wt 302.0 lb

## 2022-09-11 DIAGNOSIS — R200 Anesthesia of skin: Secondary | ICD-10-CM | POA: Diagnosis not present

## 2022-09-11 NOTE — Progress Notes (Signed)
[facility]  PATIENT: Joseph Aguirre DOB: Jun 25, 1985  REFERRING CLINICIAN: Royann Shivers, * HISTORY FROM: patient REASON FOR VISIT: new consult   HISTORICAL  CHIEF COMPLAINT:  Chief Complaint  Patient presents with   New Patient (Initial Visit)    Patient in room #7 with his wife. Patient states for awhile he has been feeling tingling and numbness in both hand. Patient it hard for him to grab and hold on to items    HISTORY OF PRESENT ILLNESS:   37 year old male here for evaluation of numbness and tingling.  Symptoms started about 5 years ago with intermittent numbness tingling in the hands and fingers.  Sometimes this is triggered by certain activities.  No issues with feet.  Has family history of Charcot-Marie-Tooth in father and paternal aunt.  No major issues with neck or low back pain.   REVIEW OF SYSTEMS: Full 14 system review of systems performed and negative with exception of: as per HPI.  ALLERGIES: Allergies  Allergen Reactions   Codeine Other (See Comments)    UNKNOWN REACTION    HOME MEDICATIONS: Outpatient Medications Prior to Visit  Medication Sig Dispense Refill   acetaminophen (TYLENOL) 500 MG tablet Take 1,500 mg by mouth as needed. For pain     ibuprofen (ADVIL) 800 MG tablet Take 1 tablet (800 mg total) by mouth 3 (three) times daily. 30 tablet 0   chlorhexidine (HIBICLENS) 4 % external liquid Apply topically daily as needed. (Patient not taking: Reported on 09/11/2022) 120 mL 0   mupirocin ointment (BACTROBAN) 2 % Apply 1 application topically 2 (two) times daily. (Patient not taking: Reported on 09/11/2022) 22 g 0   traMADol (ULTRAM) 50 MG tablet Take 1 tablet (50 mg total) by mouth every 12 (twelve) hours as needed. (Patient not taking: Reported on 09/11/2022) 10 tablet 0   No facility-administered medications prior to visit.    PAST MEDICAL HISTORY: History reviewed. No pertinent past medical history.  PAST SURGICAL  HISTORY: History reviewed. No pertinent surgical history.  FAMILY HISTORY: Family History  Problem Relation Age of Onset   Healthy Mother    Healthy Father    Charcot-Marie-Tooth disease Father     SOCIAL HISTORY: Social History   Socioeconomic History   Marital status: Married    Spouse name: Not on file   Number of children: Not on file   Years of education: Not on file   Highest education level: Not on file  Occupational History   Not on file  Tobacco Use   Smoking status: Former   Smokeless tobacco: Never  Substance and Sexual Activity   Alcohol use: No   Drug use: No   Sexual activity: Not on file  Other Topics Concern   Not on file  Social History Narrative   Not on file   Social Determinants of Health   Financial Resource Strain: Not on file  Food Insecurity: Not on file  Transportation Needs: Not on file  Physical Activity: Not on file  Stress: Not on file  Social Connections: Not on file  Intimate Partner Violence: Not on file     PHYSICAL EXAM  GENERAL EXAM/CONSTITUTIONAL: Vitals:  Vitals:   09/11/22 1015  BP: (!) 153/98  Pulse: 70  Weight: (!) 302 lb (137 kg)  Height: 5\' 10"  (1.778 m)   Body mass index is 43.33 kg/m. Wt Readings from Last 3 Encounters:  09/11/22 (!) 302 lb (137 kg)   Patient is in no distress; well  developed, nourished and groomed; neck is supple  CARDIOVASCULAR: Examination of carotid arteries is normal; no carotid bruits Regular rate and rhythm, no murmurs Examination of peripheral vascular system by observation and palpation is normal  EYES: Ophthalmoscopic exam of optic discs and posterior segments is normal; no papilledema or hemorrhages No results found.  MUSCULOSKELETAL: Gait, strength, tone, movements noted in Neurologic exam below  NEUROLOGIC: MENTAL STATUS:      No data to display         awake, alert, oriented to person, place and time recent and remote memory intact normal attention and  concentration language fluent, comprehension intact, naming intact fund of knowledge appropriate  CRANIAL NERVE:  2nd - no papilledema on fundoscopic exam 2nd, 3rd, 4th, 6th - pupils equal and reactive to light, visual fields full to confrontation, extraocular muscles intact, no nystagmus 5th - facial sensation symmetric 7th - facial strength symmetric 8th - hearing intact 9th - palate elevates symmetrically, uvula midline 11th - shoulder shrug symmetric 12th - tongue protrusion midline  MOTOR:  normal bulk and tone, full strength in the BUE, BLE HIGH ARCHES; BORDERLINE HAMMERTOES; DECR STRENGTH IN TOES  SENSORY:  normal and symmetric to light touch, pinprick, temperature, vibration  COORDINATION:  finger-nose-finger, fine finger movements normal  REFLEXES:  deep tendon reflexes 1+ and symmetric; TRACE AT ANKLES  GAIT/STATION:  narrow based gait     DIAGNOSTIC DATA (LABS, IMAGING, TESTING) - I reviewed patient records, labs, notes, testing and imaging myself where available.  No results found for: "WBC", "HGB", "HCT", "MCV", "PLT" No results found for: "NA", "K", "CL", "CO2", "GLUCOSE", "BUN", "CREATININE", "CALCIUM", "PROT", "ALBUMIN", "AST", "ALT", "ALKPHOS", "BILITOT", "GFRNONAA", "GFRAA" No results found for: "CHOL", "HDL", "LDLCALC", "LDLDIRECT", "TRIG", "CHOLHDL" No results found for: "HGBA1C" No results found for: "VITAMINB12" No results found for: "TSH"   GLUCOSE 81  URIC ACID 8.8 (h)    ASSESSMENT AND PLAN  37 y.o. year old male here with:   Dx:  1. Numbness in both hands     PLAN:  INTERMITTENT NUMBNESS - check EMG/NCS (neuropathy evaluation; RUE, RLE)  Orders Placed This Encounter  Procedures   NCV with EMG(electromyography)   Return for for NCV/EMG.    Suanne Marker, MD 09/11/2022, 11:29 AM Certified in Neurology, Neurophysiology and Neuroimaging  [facility] [address] [place], [place]  [address] [phone]

## 2022-09-26 ENCOUNTER — Encounter (HOSPITAL_COMMUNITY): Payer: Self-pay

## 2022-09-26 ENCOUNTER — Ambulatory Visit (HOSPITAL_COMMUNITY): Payer: 59

## 2022-10-23 ENCOUNTER — Ambulatory Visit (HOSPITAL_COMMUNITY): Payer: 59

## 2022-10-24 ENCOUNTER — Ambulatory Visit (INDEPENDENT_AMBULATORY_CARE_PROVIDER_SITE_OTHER): Payer: Medicaid Other | Admitting: Diagnostic Neuroimaging

## 2022-10-24 ENCOUNTER — Ambulatory Visit (INDEPENDENT_AMBULATORY_CARE_PROVIDER_SITE_OTHER): Payer: Self-pay | Admitting: Diagnostic Neuroimaging

## 2022-10-24 DIAGNOSIS — R200 Anesthesia of skin: Secondary | ICD-10-CM | POA: Diagnosis not present

## 2022-10-24 DIAGNOSIS — G5603 Carpal tunnel syndrome, bilateral upper limbs: Secondary | ICD-10-CM

## 2022-10-24 DIAGNOSIS — Z0289 Encounter for other administrative examinations: Secondary | ICD-10-CM

## 2022-10-24 NOTE — Procedures (Signed)
[facility]  NCS (NERVE CONDUCTION STUDY) WITH EMG (ELECTROMYOGRAPHY) REPORT   STUDY DATE: 10/24/22 PATIENT NAME: Joseph Aguirre DOB: 04-23-1985 MRN: [id number]  ORDERING CLINICIAN: Joycelyn Schmid, MD   TECHNOLOGIST: Jenelle Mages ELECTROMYOGRAPHER: Glenford Bayley. Nelle Sayed, MD  CLINICAL INFORMATION: 37 year old male with right > left hand numbness.  FINDINGS: NERVE CONDUCTION STUDY:  Right median motor sponsors prolonged distal latency 8.1 ms, decreased amplitude and normal conduction velocity.  Left median and bilateral ulnar motor responses are normal.  Bilateral ulnar F-wave latency is normal.  Bilateral radial and bilateral ulnar sensory responses are normal.  Right median sensory sponsor could not be obtained.  Left median sensory sponsor has prolonged peak latency and decreased amplitude.  Right median to ulnar transcarpal study shows no response with median nerve stimulation.  Left median to ulnar transcarpal comparison study shows prolonged peak latency difference.    NEEDLE ELECTROMYOGRAPHY:  Needle examination of right upper extremity is normal.    IMPRESSION:   Abnormal study demonstrating: -Bilateral median neuropathies at the wrist consistent with bilateral carpal tunnel syndrome.  This is electrodiagnostically severe on the right and mild on the left side.    INTERPRETING PHYSICIAN:  Suanne Marker, MD Certified in Neurology, Neurophysiology and Neuroimaging  [facility] [address] [place], [place] [address] [phone]  [facility]    Nerve / Sites Muscle Latency Ref. Amplitude Ref. Rel Amp Segments Distance Velocity Ref. Area    ms ms mV mV %  cm m/s m/s mVms  R Median - APB     Wrist APB 8.1 ?4.4 3.6 ?4.0 100 Wrist - APB 7   16.7     Upper arm APB 13.6  3.8  105 Upper arm - Wrist 27 50 ?49 18.6  L Median - APB     Wrist APB 4.4 ?4.4 7.1 ?4.0 100 Wrist - APB 7   29.0     Upper arm APB 9.8  6.5  90.8  Upper arm - Wrist 27 50 ?49 28.2  R Ulnar - ADM     Wrist ADM 2.7 ?3.3 7.8 ?6.0 100 Wrist - ADM 7   34.5     B.Elbow ADM 5.1  7.8  99.8 B.Elbow - Wrist 13 53 ?49 36.7     A.Elbow ADM 9.4  7.9  102 A.Elbow - B.Elbow 21 49 ?49 33.4  L Ulnar - ADM     Wrist ADM 2.8 ?3.3 8.4 ?6.0 100 Wrist - ADM 7   34.6     B.Elbow ADM 4.9  7.6  91 B.Elbow - Wrist 12 57 ?49 35.8     A.Elbow ADM 8.8  7.3  95.7 A.Elbow - B.Elbow 20 51 ?49 34.3             SNC    Nerve / Sites Rec. Site Peak Lat Ref.  Amp Ref. Segments Distance Peak Diff Ref.    ms ms V V  cm ms ms  R Radial - Anatomical snuff box (Forearm)     Forearm Wrist 2.4 ?2.9 32 ?15 Forearm - Wrist 10    L Radial - Anatomical snuff box (Forearm)     Forearm Wrist 2.2 ?2.9 20 ?15 Forearm - Wrist 10    R Median, Ulnar - Transcarpal comparison     Median Palm Wrist NR ?2.2 NR ?35 Median Palm - Wrist 8          Median Palm - Ulnar Palm  NR ?0.4  L Median, Ulnar - Transcarpal comparison     Median Palm Wrist 2.5 ?2.2 23 ?35 Median Palm - Wrist 8       Ulnar Palm Wrist 1.7 ?2.2 22 ?12 Ulnar Palm - Wrist 8          Median Palm - Ulnar Palm  0.9 ?0.4  R Median - Orthodromic (Dig II, Mid palm)     Dig II Wrist NR ?3.4 NR ?10 Dig II - Wrist 13    L Median - Orthodromic (Dig II, Mid palm)     Dig II Wrist 4.0 ?3.4 8 ?10 Dig II - Wrist 13    R Ulnar - Orthodromic, (Dig V, Mid palm)     Dig V Wrist 2.8 ?3.1 10 ?5 Dig V - Wrist 11    L Ulnar - Orthodromic, (Dig V, Mid palm)     Dig V Wrist 2.8 ?3.1 8 ?5 Dig V - Wrist 41                       F  Wave    Nerve F Lat Ref.   ms ms  R Ulnar - ADM 30.7 ?32.0  L Ulnar - ADM 31.2 ?32.0         EMG Summary Table    Spontaneous MUAP Recruitment  Muscle IA Fib PSW Fasc Other Amp Dur. Poly Pattern  R. Deltoid Normal None None None _______ Normal Normal Normal Normal  R. Biceps brachii Normal None None None _______ Normal Normal Normal Normal  R. Triceps brachii Normal None None None _______ Normal Normal  Normal Normal  R. Flexor carpi radialis Normal None None None _______ Normal Normal Normal Normal  R. First dorsal interosseous Normal None None None _______ Normal Normal Normal Normal

## 2022-10-29 ENCOUNTER — Telehealth: Payer: Self-pay | Admitting: Diagnostic Neuroimaging

## 2022-10-29 NOTE — Telephone Encounter (Signed)
Referral for hand surgery fax to [facility]. Phone: [phone], Fax: [phone].

## 2022-11-20 ENCOUNTER — Ambulatory Visit (HOSPITAL_COMMUNITY)
Admission: RE | Admit: 2022-11-20 | Discharge: 2022-11-20 | Disposition: A | Discharge status: Home / Self Care | Payer: Medicaid Other | Source: Ambulatory Visit | Attending: Physician Assistant | Admitting: Physician Assistant

## 2022-11-20 DIAGNOSIS — N2889 Other specified disorders of kidney and ureter: Secondary | ICD-10-CM

## 2022-11-21 ENCOUNTER — Ambulatory Visit (HOSPITAL_COMMUNITY): Admission: RE | Admit: 2022-11-21 | Payer: Medicaid Other | Source: Ambulatory Visit

## 2022-11-21 ENCOUNTER — Other Ambulatory Visit (HOSPITAL_COMMUNITY): Payer: Self-pay | Admitting: Physician Assistant

## 2022-11-21 DIAGNOSIS — N2889 Other specified disorders of kidney and ureter: Secondary | ICD-10-CM

## 2022-11-22 ENCOUNTER — Encounter (HOSPITAL_COMMUNITY): Payer: Self-pay

## 2022-11-22 ENCOUNTER — Ambulatory Visit (HOSPITAL_COMMUNITY): Admission: RE | Admit: 2022-11-22 | Payer: Medicaid Other | Source: Ambulatory Visit

## 2023-12-24 ENCOUNTER — Other Ambulatory Visit: Payer: Self-pay

## 2023-12-24 ENCOUNTER — Ambulatory Visit: Admitting: Allergy & Immunology

## 2023-12-24 ENCOUNTER — Encounter: Payer: Self-pay | Admitting: Allergy & Immunology

## 2023-12-24 VITALS — BP 130/80 | HR 90 | Temp 97.3°F | Resp 18 | Ht 68.9 in | Wt 297.2 lb

## 2023-12-24 DIAGNOSIS — K219 Gastro-esophageal reflux disease without esophagitis: Secondary | ICD-10-CM | POA: Insufficient documentation

## 2023-12-24 DIAGNOSIS — J310 Chronic rhinitis: Secondary | ICD-10-CM | POA: Insufficient documentation

## 2023-12-24 DIAGNOSIS — Z8709 Personal history of other diseases of the respiratory system: Secondary | ICD-10-CM | POA: Insufficient documentation

## 2023-12-24 NOTE — Patient Instructions (Addendum)
 1. 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 - We are all working to fight this, but for now we need to do two separate visits.  - We will know more after we do testing at the next visit.  - The skin testing visit can be squeezed in at your convenience.  - Then we can make a more full plan to address all of your symptoms. - Be sure to stop your antihistamines for 3 days before this appointment.  - Restart the Flonase and do it EVERY time that you use your Afrin. - We will come up with a plan to wean off of this completely. - Using it the way you are leads to something called rhinitis medicamentosa.   2. History of asthma - This seems to be quiescent.  3. GERD  - We may consider adding a daily reflux medication if testing was unremarkable.  4. Recurrent infections  - This seems to not be a problem at this point. - We may consider doing some labs in the future.   5. Return in about 1 week (around 12/31/2023) for SKIN TESTING. You can have the follow up appointment with Dr. Iva or a Nurse Practicioner (our Nurse Practitioners are excellent and always have Physician oversight!).    Please inform us  of any Emergency Department visits, hospitalizations, or changes in symptoms. Call us  before going to the ED for breathing or allergy symptoms since we might be able to fit you in for a sick visit. Feel free to contact us  anytime with any questions, problems, or concerns.  It was a pleasure to meet you today!  Websites that have reliable patient information: 1. American Academy of Asthma, Allergy, and Immunology: www.aaaai.org 2. Food Allergy Research and Education (FARE): foodallergy.org 3. Mothers of Asthmatics: http://www.asthmacommunitynetwork.org 4. American College of Allergy, Asthma, and Immunology: www.acaai.org      "Like" us  on Facebook and Instagram for our  latest updates!      A healthy democracy works best when [employer] participate! Make sure you are registered to vote! If you have moved or changed any of your contact information, you will need to get this updated before voting! Scan the QR codes below to learn more!       Nasal decongestant sprays can cause a disorder called rhinitis medicamentosa. The physical examination in patients with rhinitis medicamentosa often reveals swollen, red nasal mucous membranes. Diagnosis most heavily relies upon the history of use of a causative medication.  Many days of regular use of over-the-counter decongestant nasal sprays leads to rebound nasal congestion as the medication wears off, prompting patients to administer the medicine more frequently to obtain relief. This begins a vicious cycle of nasal congestion, both caused and temporarily relieved by the medication, with escalating use and eventual dependency. The risk of developing rhinitis medicamentosa from over-the-counter nasal decongestants can be minimized by limiting use to a maximum of approximately five days, not exceeding recommended frequencies, and using as few doses as possible during those days. There is also evidence that this is less likely to occur if the patient is administering an intranasal corticosteroid along with the nasal decongestant.

## 2023-12-24 NOTE — Progress Notes (Signed)
 NEW PATIENT  Date of Service/Encounter:  12/24/23  Consult requested by: Skillman, Katherine E, PA-C   Assessment:   Chronic rhinitis - planning for skin testing at the next visit  History of asthma  Gastroesophageal reflux disease  Plan/Recommendations:   1. 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 - We are all working to fight this, but for now we need to do two separate visits.  - We will know more after we do testing at the next visit.  - The skin testing visit can be squeezed in at your convenience.  - Then we can make a more full plan to address all of your symptoms. - Be sure to stop your antihistamines for 3 days before this appointment.  - Restart the Flonase and do it EVERY time that you use your Afrin. - We will come up with a plan to wean off of this completely. - Using it the way you are leads to something called rhinitis medicamentosa.   2. History of asthma - This seems to be quiescent.  3. GERD  - We may consider adding a daily reflux medication if testing was unremarkable.  4. Recurrent infections  - This seems to not be a problem at this point. - We may consider doing some labs in the future.   5. Return in about 1 week (around 12/31/2023) for SKIN TESTING. You can have the follow up appointment with Dr. Iva or a Nurse Practicioner (our Nurse Practitioners are excellent and always have Physician oversight!).     This note in its entirety was forwarded to the Provider who requested this consultation.  Subjective:   Joseph Aguirre is a 38 y.o. male presenting today for evaluation of  Chief Complaint  Patient presents with   Nasal Congestion    Started about a year ago has issue when he's outside, inside and at night .     Joseph Aguirre has a history of the following: Patient Active Problem List   Diagnosis Date Noted    Chronic rhinitis 12/24/2023   Gastroesophageal reflux disease 12/24/2023   History of asthma 12/24/2023    History obtained from: chart review and patient.  Discussed the use of AI scribe software for clinical note transcription with the patient and/or guardian, who gave verbal consent to proceed.  Joseph Aguirre was referred by Skillman, Katherine E, PA-C.     Joseph Aguirre is a 38 y.o. male presenting for an evaluation of nasal congestion.  He has experienced nasal congestion for nearly a year, with symptoms that vary in severity. The congestion worsens with exposure to dust and certain environments, and is more pronounced at night. He uses Afrin nasal spray more frequently than recommended, particularly at night, which provides temporary relief but seems to worsen the congestion later. He has tried Flonase in the past without consistent relief, possibly due to inconsistent use. Currently, he uses Xyzal, which provides some relief but not consistently.  He has not been tested for allergies and is unsure about any family history of allergies. He acquired a cat over a year ago, coinciding with the onset of his symptoms, but he does not believe the cat is the cause. He recalls visiting an ENT within the past year, who diagnosed him with a deviated septum and suggested surgery, which he has not pursued. He does not usually have sinus or ear infections,  but did see an ENT within the past year. He reports taking leftover amoxicillin  for a toothache within the past few days.  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  He does not think his symptoms correlated with starting his new job.  He previously worked with the same career at a steady different company.    Asthma/Respiratory Symptom History: He has a history of childhood asthma, for which he used albuterol, but he has not used it in a long time and denies current asthma symptoms such as  nocturnal coughing.   GERD Symptom History: He also has a history of acid reflux, which was severe during his youth but is now managed by avoiding late meals and occasionally taking medication before bed.  Otherwise, there is no history of other atopic diseases, including drug allergies, stinging insect allergies, or contact dermatitis. There is no significant infectious history. Vaccinations are up to date.    Past Medical History: Patient Active Problem List   Diagnosis Date Noted   Chronic rhinitis 12/24/2023   Gastroesophageal reflux disease 12/24/2023   History of asthma 12/24/2023    Medication List:  Allergies as of 12/24/2023       Reactions   Codeine Other (See Comments)   UNKNOWN REACTION        Medication List        Accurate as of December 24, 2023  4:17 PM. If you have any questions, ask your nurse or doctor.          acetaminophen 500 MG tablet Commonly known as: TYLENOL Take 1,500 mg by mouth as needed. For pain   ibuprofen  800 MG tablet Commonly known as: ADVIL  Take 1 tablet (800 mg total) by mouth 3 (three) times daily.        Birth History: non-contributory  Developmental History: non-contributory  Past Surgical History: No past surgical history on file.   Family History: Family History  Problem Relation Age of Onset   Healthy Mother    Healthy Father    Charcot-Marie-Tooth disease Father      Social History: Joseph Aguirre lives at home with her family, including his wife. They live in a house that is 83 years old. There is carpeting throughout the home. They have electric heating and central cooling with fan. There is one cat in the home. There are not dust mite coverings on the bedding. He currently works as a tow naval architect. He has been doing this for one year. There is exposure to fumes, chemicals, and dust.    Review of systems otherwise negative other than that mentioned in the HPI.    Objective:   Blood pressure 130/80,  pulse 90, temperature (!) 97.3 F (36.3 C), temperature source Temporal, resp. rate 18, height 5' 8.9 (1.75 m), weight 297 lb 3.2 oz (134.8 kg), SpO2 97%. Body mass index is 44.02 kg/m.     Physical Exam Vitals reviewed.  Constitutional:      Appearance: He is well-developed.  HENT:     Head: Normocephalic and atraumatic.     Right Ear: Tympanic membrane, ear canal and external ear normal. No drainage, swelling or tenderness. Tympanic membrane is not injected, scarred, erythematous, retracted or bulging.     Left Ear: Tympanic membrane, ear canal and external ear normal. No drainage, swelling or tenderness. Tympanic membrane is not injected, scarred, erythematous, retracted or bulging.     Nose: No nasal deformity, septal deviation,  mucosal edema or rhinorrhea.     Right Turbinates: Enlarged, swollen and pale.     Left Turbinates: Enlarged, swollen and pale.     Right Sinus: No maxillary sinus tenderness or frontal sinus tenderness.     Left Sinus: No maxillary sinus tenderness or frontal sinus tenderness.     Comments: No epistaxis noted.     Mouth/Throat:     Lips: Pink.     Mouth: Mucous membranes are moist. Mucous membranes are not pale and not dry.     Pharynx: Uvula midline.     Comments: Mild cobblestoning.  Eyes:     General:        Right eye: No discharge.        Left eye: No discharge.     Conjunctiva/sclera: Conjunctivae normal.     Right eye: Right conjunctiva is not injected. No chemosis.    Left eye: Left conjunctiva is not injected. No chemosis.    Pupils: Pupils are equal, round, and reactive to light.  Cardiovascular:     Rate and Rhythm: Normal rate and regular rhythm.     Heart sounds: Normal heart sounds.  Pulmonary:     Effort: Pulmonary effort is normal. No tachypnea, accessory muscle usage or respiratory distress.     Breath sounds: Normal breath sounds. No wheezing, rhonchi or rales.  Chest:     Chest wall: No tenderness.  Abdominal:      Tenderness: There is no abdominal tenderness. There is no guarding or rebound.  Lymphadenopathy:     Head:     Right side of head: No submandibular, tonsillar or occipital adenopathy.     Left side of head: No submandibular, tonsillar or occipital adenopathy.     Cervical: No cervical adenopathy.  Skin:    Coloration: Skin is not pale.     Findings: No abrasion, erythema, petechiae or rash. Rash is not papular, urticarial or vesicular.  Neurological:     Mental Status: He is alert.  Psychiatric:        Behavior: Behavior is cooperative.      Diagnostic studies: deferred due to insurance stipulations that require a separate visit for testing        Marty Shaggy, MD [facility] 

## 2024-01-07 ENCOUNTER — Ambulatory Visit: Admitting: Allergy & Immunology
